# Patient Record
Sex: Male | Born: 1980 | Race: Black or African American | Hispanic: No | Marital: Married | Smoking: Current some day smoker
Health system: Southern US, Community
[De-identification: ages and names within clinical notes are randomized; demographics above are authoritative.]

---

## 2016-06-23 ENCOUNTER — Encounter (HOSPITAL_BASED_OUTPATIENT_CLINIC_OR_DEPARTMENT_OTHER): Payer: Self-pay | Admitting: *Deleted

## 2016-06-23 ENCOUNTER — Emergency Department (HOSPITAL_BASED_OUTPATIENT_CLINIC_OR_DEPARTMENT_OTHER)
Admission: EM | Admit: 2016-06-23 | Discharge: 2016-06-23 | Disposition: A | Discharge status: Home / Self Care | Payer: BLUE CROSS/BLUE SHIELD | Attending: Emergency Medicine | Admitting: Emergency Medicine

## 2016-06-23 ENCOUNTER — Emergency Department (HOSPITAL_BASED_OUTPATIENT_CLINIC_OR_DEPARTMENT_OTHER): Payer: BLUE CROSS/BLUE SHIELD

## 2016-06-23 DIAGNOSIS — S61310A Laceration without foreign body of right index finger with damage to nail, initial encounter: Secondary | ICD-10-CM | POA: Insufficient documentation

## 2016-06-23 DIAGNOSIS — S61300A Unspecified open wound of right index finger with damage to nail, initial encounter: Secondary | ICD-10-CM | POA: Diagnosis present

## 2016-06-23 DIAGNOSIS — S61309A Unspecified open wound of unspecified finger with damage to nail, initial encounter: Secondary | ICD-10-CM

## 2016-06-23 DIAGNOSIS — Y929 Unspecified place or not applicable: Secondary | ICD-10-CM | POA: Insufficient documentation

## 2016-06-23 DIAGNOSIS — S61208A Unspecified open wound of other finger without damage to nail, initial encounter: Secondary | ICD-10-CM

## 2016-06-23 DIAGNOSIS — Y999 Unspecified external cause status: Secondary | ICD-10-CM | POA: Insufficient documentation

## 2016-06-23 DIAGNOSIS — Y939 Activity, unspecified: Secondary | ICD-10-CM | POA: Insufficient documentation

## 2016-06-23 DIAGNOSIS — W231XXA Caught, crushed, jammed, or pinched between stationary objects, initial encounter: Secondary | ICD-10-CM | POA: Insufficient documentation

## 2016-06-23 MED ORDER — CEFAZOLIN SODIUM-DEXTROSE 1-4 GM/50ML-% IV SOLN
1.0000 g | Freq: Once | INTRAVENOUS | Status: AC
  Administered 2016-06-23: 1 g via INTRAVENOUS
  Filled 2016-06-23: qty 50

## 2016-06-23 MED ORDER — HYDROCODONE-ACETAMINOPHEN 5-325 MG PO TABS: 1.0000 | ORAL_TABLET | Freq: Four times a day (QID) | ORAL | 0 refills | Status: DC | PRN

## 2016-06-23 MED ORDER — LIDOCAINE HCL 2 % IJ SOLN
10.0000 mL | Freq: Once | INTRAMUSCULAR | Status: AC
  Administered 2016-06-23: 200 mg
  Filled 2016-06-23: qty 20

## 2016-06-23 MED ORDER — CEPHALEXIN 500 MG PO CAPS: 500.0000 mg | ORAL_CAPSULE | Freq: Four times a day (QID) | ORAL | 0 refills | Status: AC

## 2016-06-23 NOTE — ED Provider Notes (Signed)
[facility] Provider Note: Dwayne Dell, MD, FACEP  CSN: [id number] MRN: [id number] ARRIVAL: 06/23/16 at 0452 ROOM: [facility]09/[facility]09   CHIEF COMPLAINT  Finger Injury   HISTORY OF PRESENT ILLNESS  Dwayne Hammond is a 36 y.o. 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 He got his right index finger caught and the dorsal aspect of the distal right index finger was avulsed. He states he was able to "see the bone" through the wound. The resulting laceration is not fully circumferential. Sensation is still intact in the finger distally, and flexion and extension are intact at the DIP joint. He rates his pain as a 5 out of 10, worse with movement. His tetanus is up-to-date. He denies other injury.   History reviewed. No pertinent past medical history.  History reviewed. No pertinent surgical history.  No family history on file.  Social History  Substance Use Topics  . Smoking status: Not on file  . Smokeless tobacco: Not on file  . Alcohol use Not on file    Prior to Admission medications   Not on File    Allergies Patient has no known allergies.   REVIEW OF SYSTEMS  Negative except as noted here or in the History of Present Illness.   PHYSICAL EXAMINATION  Initial Vital Signs Blood pressure 93/75, pulse 82, temperature 98.3 F (36.8 C), temperature source Oral, resp. rate 18, height 6\' 2"  (1.88 m), weight 74.4 kg (164 lb), SpO2 100 %.  Examination General: Well-developed, well-nourished male in no acute distress; appearance consistent with age of record HENT: normocephalic; atraumatic Eyes: Normal appearance Neck: supple Heart: regular rate and rhythm Lungs: clear to auscultation bilaterally Abdomen: soft; nondistended; nontender; bowel sounds present Extremities: No bony deformity; full range of motion except DIP joint of right index finger; partial avulsion of distal right index finger with sparing of the volar side, sensation intact distally  with intact flexion and extension at the DIP joint, capillary refill brisk on volar pad but difficult to assess on dorsal side, small satellite laceration on volar pad, nail avulsed from nail fold and nail bed, no laceration to nail bed seen:        Neurologic: Awake, alert and oriented; motor function intact in all extremities and symmetric; no facial droop Skin: Warm and dry Psychiatric: Normal mood and affect   RESULTS  Summary of this visit's results, reviewed by myself:   EKG Interpretation  Date/Time:    Ventricular Rate:    PR Interval:    QRS Duration:   QT Interval:    QTC Calculation:   R Axis:     Text Interpretation:        Laboratory Studies: No results found for this or any previous visit (from the past 24 hour(s)). Imaging Studies: Dg Finger Index Right  Result Date: 06/23/2016 CLINICAL DATA:  Right index finger pain after injury. Smashed finger with laceration. EXAM: RIGHT INDEX FINGER 2+V COMPARISON:  None. FINDINGS: There is no evidence of fracture or dislocation. There is no evidence of arthropathy or other focal bone abnormality. Laceration about the radial aspect of the distal interphalangeal joint. Scattered soft tissue air about the distal phalanx about the radial and volar surface. There is a punctate density in the subcutaneous tissues about the distal tuft. IMPRESSION: Soft tissue injury to the distal digit with laceration and soft tissue air. Probable punctate foreign body. No associated osseous abnormality. Electronically Signed   By: Lujean Rave.D.  On: 06/23/2016 05:46    ED COURSE  Nursing notes and initial vitals signs, including pulse oximetry, reviewed.  Vitals:   06/23/16 0505 06/23/16 0510  BP: 93/75   Pulse: 82   Resp: 18   Temp: 98.3 F (36.8 C)   TempSrc: Oral   SpO2: 100%   Weight:  74.4 kg (164 lb)  Height:  6\' 2"  (1.88 m)   Ancef one gram given IV.  PROCEDURES   LACERATION REPAIR x 2 and NAIL AVULSION  REPAIR Performed by: Zarahi Fuerst L Authorized by: Hanley SeamenMOLPUS,Chastelyn Athens L Consent: Verbal consent obtained. Risks and benefits: risks, benefits and alternatives were discussed Consent given by: patient Patient identity confirmed: provided demographic data Prepped and Draped in normal sterile fashion Wounds explored  Laceration Location: right index finger  Laceration Length: 6 cm (dorsal); 1 cm (volar pad)  No Foreign Bodies seen or palpated  Anesthesia: Digital block   Local anesthetic: lidocaine 2 % without epinephrine  Anesthetic total: 4 ml  Irrigation method: syringe Amount of cleaning: copious   Suture material: 4-0 Prolene   Number of sutures: 2 for fixation of distal nail onto nail bed Number of sutures: 12 (dorsal) Number of sutures: 2 (volar pad)  Technique: Simple interrupted   Patient tolerance: Patient tolerated the procedure well with no immediate complications.        We'll have patient follow-up with hand surgeon as this represents a fairly significant injury. The patient was advised that the avulsed skin may potentially be devitalized although it does not appear devitalized at this time and sensation is still intact. He was advised that if the skin does become devitalized he may require additional surgery on the finger. He was also advised that since the nail is avulsed it is essentially dead. He will likely grow a new nail but this is also not guaranteed.  ED DIAGNOSES     ICD-10-CM   1. Avulsion of skin of index finger, initial encounter S61.208A   2. Traumatic avulsion of nail plate of finger, initial encounter S61.309A        [name], Jonny RuizJohn, MD 06/23/16 [phone]

## 2016-06-23 NOTE — ED Triage Notes (Signed)
Mash and lac to rt index finger at work  Bleeding controlled  Pt states uds not needed

## 2017-12-23 ENCOUNTER — Encounter (HOSPITAL_BASED_OUTPATIENT_CLINIC_OR_DEPARTMENT_OTHER): Payer: Self-pay | Admitting: *Deleted

## 2017-12-23 ENCOUNTER — Emergency Department (HOSPITAL_BASED_OUTPATIENT_CLINIC_OR_DEPARTMENT_OTHER)
Admission: EM | Admit: 2017-12-23 | Discharge: 2017-12-23 | Disposition: A | Discharge status: Home / Self Care | Payer: Worker's Compensation | Attending: Emergency Medicine | Admitting: Emergency Medicine

## 2017-12-23 ENCOUNTER — Emergency Department (HOSPITAL_BASED_OUTPATIENT_CLINIC_OR_DEPARTMENT_OTHER): Payer: Worker's Compensation

## 2017-12-23 ENCOUNTER — Other Ambulatory Visit: Payer: Self-pay

## 2017-12-23 DIAGNOSIS — Y939 Activity, unspecified: Secondary | ICD-10-CM | POA: Diagnosis not present

## 2017-12-23 DIAGNOSIS — S6991XA Unspecified injury of right wrist, hand and finger(s), initial encounter: Secondary | ICD-10-CM | POA: Diagnosis present

## 2017-12-23 DIAGNOSIS — W231XXA Caught, crushed, jammed, or pinched between stationary objects, initial encounter: Secondary | ICD-10-CM | POA: Insufficient documentation

## 2017-12-23 DIAGNOSIS — Z79899 Other long term (current) drug therapy: Secondary | ICD-10-CM | POA: Insufficient documentation

## 2017-12-23 DIAGNOSIS — F17210 Nicotine dependence, cigarettes, uncomplicated: Secondary | ICD-10-CM | POA: Insufficient documentation

## 2017-12-23 DIAGNOSIS — F17200 Nicotine dependence, unspecified, uncomplicated: Secondary | ICD-10-CM | POA: Diagnosis not present

## 2017-12-23 DIAGNOSIS — Y990 Civilian activity done for income or pay: Secondary | ICD-10-CM | POA: Diagnosis not present

## 2017-12-23 DIAGNOSIS — S61310A Laceration without foreign body of right index finger with damage to nail, initial encounter: Secondary | ICD-10-CM | POA: Diagnosis not present

## 2017-12-23 DIAGNOSIS — S61309A Unspecified open wound of unspecified finger with damage to nail, initial encounter: Secondary | ICD-10-CM

## 2017-12-23 DIAGNOSIS — Y929 Unspecified place or not applicable: Secondary | ICD-10-CM | POA: Diagnosis not present

## 2017-12-23 MED ORDER — HYDROCODONE-ACETAMINOPHEN 5-325 MG PO TABS: 1.0000 | ORAL_TABLET | Freq: Four times a day (QID) | ORAL | 0 refills | Status: AC | PRN

## 2017-12-23 MED ORDER — LIDOCAINE HCL 2 % IJ SOLN
10.0000 mL | Freq: Once | INTRAMUSCULAR | Status: AC
  Administered 2017-12-23: 200 mg
  Filled 2017-12-23: qty 20

## 2017-12-23 MED FILL — HYDROCODON-APAP 5-325: 5-325 | 2 days supply | Qty: 8 | Fill #0

## 2017-12-23 NOTE — ED Provider Notes (Signed)
[facility] EMERGENCY DEPARTMENT Provider Note   CSN: [id number] Arrival date & time: 12/23/17  0744     History   Chief Complaint Chief Complaint  Patient presents with  . Finger Injury    HPI Felicie MornDavid Dematteo is a 37 y.o. male.  The history is provided by the patient. No language interpreter was used.   Felicie MornDavid Ellegood is a 37 y.o. male who presents to the Emergency Department complaining of finger injury. He presents to the emergency department for evaluation of right index finger injury that occurred just prior to ED arrival. He was working on a line when he looked away and his finger got caught in a roller. He pulled his finger out. He has a history of prior injury to this hand. He is right-hand dominant. He has chronic numbness to the tip of that finger. He is able to move it. He denies additional injuries. Symptoms are severe and constant in nature. History reviewed. No pertinent past medical history.  There are no active problems to display for this patient.   History reviewed. No pertinent surgical history.      Home Medications    Prior to Admission medications   Medication Sig Start Date End Date Taking? Authorizing Provider  cephALEXin (KEFLEX) 500 MG capsule Take 1 capsule (500 mg total) by mouth 4 (four) times daily. 06/23/16   Molpus, John, MD  HYDROcodone-acetaminophen (NORCO/VICODIN) 5-325 MG tablet Take 1 tablet by mouth every 6 (six) hours as needed (for pain). 06/23/16   Molpus, Jonny RuizJohn, MD    Family History History reviewed. No pertinent family history.  Social History Social History   Tobacco Use  . Smoking status: Current Some Day Smoker  . Smokeless tobacco: Never Used  Substance Use Topics  . Alcohol use: Yes    Alcohol/week: 2.0 standard drinks    Types: 2 Cans of beer per week    Frequency: Never  . Drug use: Never     Allergies   Patient has no known allergies.   Review of Systems Review of Systems  All other systems reviewed  and are negative.    Physical Exam Updated Vital Signs BP 139/82 (BP Location: Left Arm)   Pulse 76   Temp 98.2 F (36.8 C) (Oral)   Resp 18   Ht 6\' 2"  (1.88 m)   Wt 74.8 kg   SpO2 100%   BMI 21.18 kg/m   Physical Exam Vitals signs and nursing note reviewed.  Constitutional:      Appearance: Normal appearance.  HENT:     Head: Normocephalic and atraumatic.  Neck:     Musculoskeletal: Neck supple.  Cardiovascular:     Rate and Rhythm: Normal rate and regular rhythm.  Musculoskeletal:     Comments: 2+ write radial pulse. There is a skin avulsion to the distal aspect of the right index finger. There is no nail bed injury. The nail is partially avulsed. There is a 2 cm laceration to the palmar aspects of the right index finger. Flexion extension is intact throughout the right index finger against resistance. There is altered sensation to light touch across the tip of the index finger, otherwise sensation intact.  Skin:    General: Skin is warm and dry.     Capillary Refill: Capillary refill takes less than 2 seconds.  Neurological:     Mental Status: He is alert and oriented to person, place, and time.  Psychiatric:        Mood and Affect:  Mood normal.        Behavior: Behavior normal.        ED Treatments / Results  Labs (all labs ordered are listed, but only abnormal results are displayed) Labs Reviewed - No data to display  EKG None  Radiology Dg Finger Index Right  Result Date: 12/23/2017 CLINICAL DATA:  Finger caught in metal roller EXAM: RIGHT SECOND FINGER 2+V COMPARISON:  June 23, 2016 FINDINGS: Frontal, oblique, and lateral views were obtained. There is soft tissue injury distally with ungual disruption. Bony structures appear intact without evident fracture or dislocation. Joint spaces appear normal. Tiny radiopaque foreign bodies are noted in the distal aspect of the second digit. IMPRESSION: No acute fracture or dislocation. There is ungual disruption  with soft tissue injury to the second distal digit. Several tiny radiopaque foreign bodies are noted in the distal aspect of the second digit. No appreciable joint space narrowing or erosion. Electronically Signed   By: Bretta BangWilliam  Woodruff III M.D.   On: 12/23/2017 08:48    Procedures .Marland Kitchen.Laceration Repair Date/Time: 12/23/2017 10:10 AM Performed by: Tilden Fossaees, Rowen Wilmer, MD Authorized by: Tilden Fossaees, Madonna Flegal, MD   Consent:    Consent obtained:  Verbal   Consent given by:  Patient   Risks discussed:  Infection, need for additional repair, poor cosmetic result, poor wound healing and pain Anesthesia (see MAR for exact dosages):    Anesthesia method:  Nerve block   Block needle gauge:  27 G   Block anesthetic:  Lidocaine 2% w/o epi   Block injection procedure:  Anatomic landmarks identified, introduced needle, incremental injection and anatomic landmarks palpated   Block outcome:  Anesthesia achieved Laceration details:    Location:  Finger   Finger location:  R index finger   Length (cm):  2.5 Repair type:    Repair type:  Simple Pre-procedure details:    Preparation:  Patient was prepped and draped in usual sterile fashion Exploration:    Hemostasis achieved with:  Direct pressure   Wound exploration: wound explored through full range of motion     Contaminated: no   Treatment:    Area cleansed with:  Betadine and saline   Amount of cleaning:  Standard Skin repair:    Repair method:  Sutures   Suture size:  4-0   Suture material:  Prolene   Suture technique:  Simple interrupted   Number of sutures:  2 Approximation:    Approximation:  Close Post-procedure details:    Dressing:  Splint for protection and antibiotic ointment   Patient tolerance of procedure:  Tolerated well, no immediate complications .Marland Kitchen.Laceration Repair Date/Time: 12/23/2017 10:12 AM Performed by: Tilden Fossaees, Zayveon Raschke, MD Authorized by: Tilden Fossaees, Malaney Mcbean, MD   Consent:    Consent obtained:  Verbal   Consent given by:   Patient   Risks discussed:  Infection, need for additional repair, poor cosmetic result, poor wound healing and pain Anesthesia (see MAR for exact dosages):    Anesthesia method:  Nerve block Laceration details:    Location:  Finger   Finger location:  R index finger   Length (cm):  4 Repair type:    Repair type:  Complex Pre-procedure details:    Preparation:  Patient was prepped and draped in usual sterile fashion Exploration:    Hemostasis achieved with:  Direct pressure   Wound exploration: wound explored through full range of motion   Treatment:    Area cleansed with:  Betadine and saline   Amount of cleaning:  Standard  Skin repair:    Repair method:  Sutures   Suture size:  4-0   Suture material:  Prolene   Number of sutures:  6 Approximation:    Approximation:  Close Post-procedure details:    Dressing:  Splint for protection and non-adherent dressing   Patient tolerance of procedure:  Tolerated well, no immediate complications Comments:     There is an irregular avulsion to the skin just proximal to the nail bed that was sutured in place with four, 4-0 sutures. The nail was removed and the nail bed was irrigated with saline. There was no nail bed laceration. The nail was replaced and sutured in place with two, 4-0sutures.   (including critical care time)  Medications Ordered in ED Medications  lidocaine (XYLOCAINE) 2 % (with pres) injection 200 mg (200 mg Infiltration Given 12/23/17 1610)     Initial Impression / Assessment and Plan / ED Course  I have reviewed the triage vital signs and the nursing notes.  Pertinent labs & imaging results that were available during my care of the patient were reviewed by me and considered in my medical decision making (see chart for details).     Patient here for evaluation of finger injury. No evidence of foreign body on evaluation or tendon injury. The nail was replaced and of all skin was tacked down in separate laceration  was repaired as well. He will be placed in a splint for comfort and protection with plan for outpatient hand follow-up. Counseled patient on return precautions.  Final Clinical Impressions(s) / ED Diagnoses   Final diagnoses:  None    ED Discharge Orders    None       Tilden Fossa, MD 12/23/17 [phone]

## 2017-12-23 NOTE — ED Triage Notes (Signed)
Index finger on R hand.

## 2017-12-23 NOTE — ED Notes (Signed)
ND at this time. Pt is stable and going home.

## 2018-05-30 IMAGING — DX DG FINGER INDEX 2+V*R*
3 series · 3 of 3 positions shown · non-contrast
Comparison: None.

CLINICAL DATA: Right index finger pain after injury. Smashed finger
with laceration.

EXAM:
RIGHT INDEX FINGER 2+V

[finger ap]
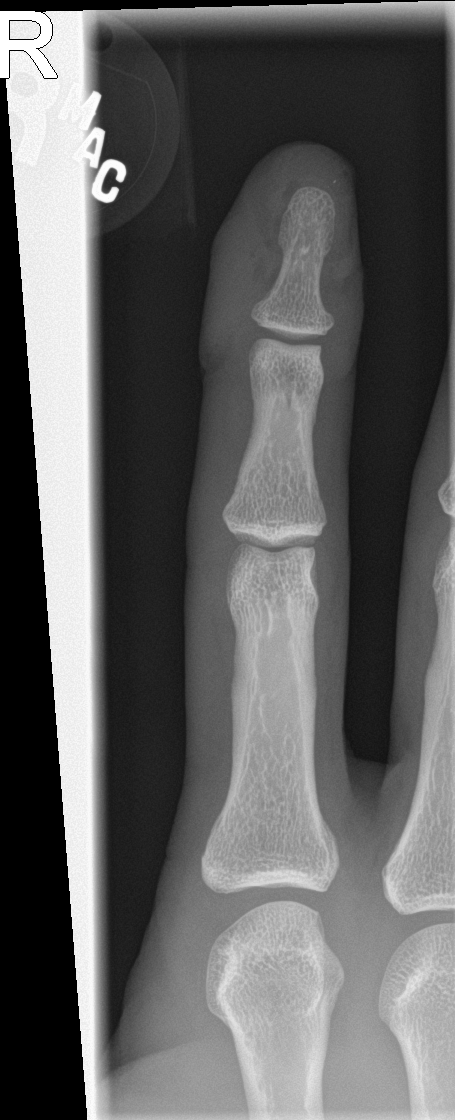

[finger obl]
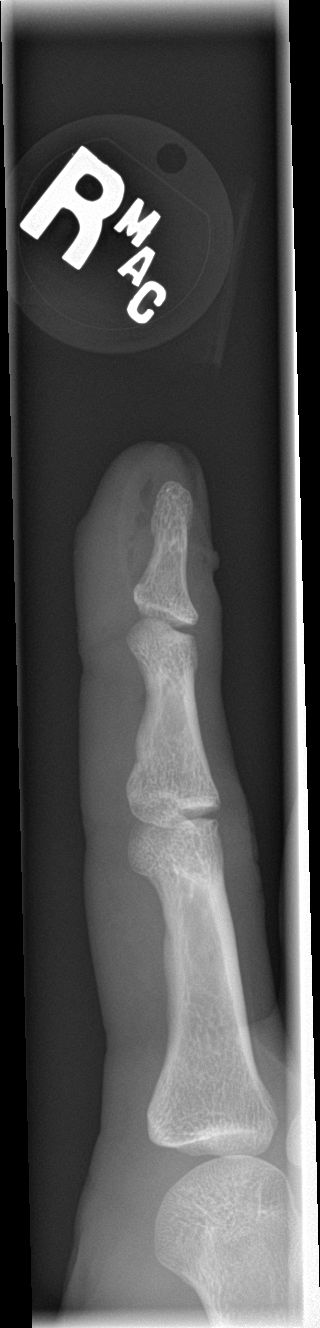

[finger lat]
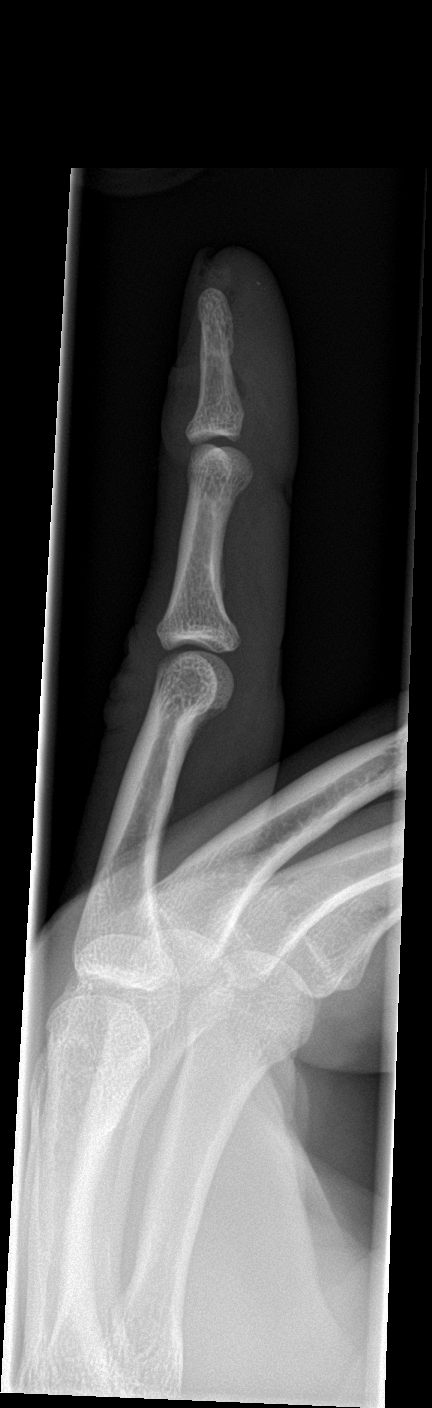

[3 of 3 positions shown; findings below may reference images not displayed]

FINDINGS: There is no evidence of fracture or dislocation. There is no
evidence of arthropathy or other focal bone abnormality. Laceration
about the radial aspect of the distal interphalangeal joint.
Scattered soft tissue air about the distal phalanx about the radial
and volar surface. There is a punctate density in the subcutaneous
tissues about the distal tuft.
IMPRESSION: Soft tissue injury to the distal digit with laceration and soft
tissue air. Probable punctate foreign body. No associated osseous
abnormality.
